# Patient Record
Sex: Female | Born: 1990 | Race: White | Hispanic: No | Marital: Single | Smoking: Never smoker
Health system: Southern US, Community
[De-identification: ages and names within clinical notes are randomized; demographics above are authoritative.]

## PROBLEM LIST (undated history)

## (undated) DIAGNOSIS — B009 Herpesviral infection, unspecified: Secondary | ICD-10-CM

---

## 2016-09-29 ENCOUNTER — Emergency Department (HOSPITAL_COMMUNITY)
Admission: EM | Admit: 2016-09-29 | Discharge: 2016-09-29 | Disposition: A | Discharge status: Home / Self Care | Payer: Self-pay | Attending: Dermatology | Admitting: Dermatology

## 2016-09-29 ENCOUNTER — Encounter (HOSPITAL_COMMUNITY): Payer: Self-pay | Admitting: *Deleted

## 2016-09-29 ENCOUNTER — Encounter (HOSPITAL_COMMUNITY): Payer: Self-pay | Admitting: Emergency Medicine

## 2016-09-29 ENCOUNTER — Inpatient Hospital Stay (HOSPITAL_COMMUNITY): Payer: Medicaid Other

## 2016-09-29 ENCOUNTER — Inpatient Hospital Stay (HOSPITAL_COMMUNITY)
Admission: AD | Admit: 2016-09-29 | Discharge: 2016-09-30 | Disposition: A | Discharge status: Home / Self Care | Payer: Medicaid Other | Source: Ambulatory Visit | Attending: Obstetrics and Gynecology | Admitting: Obstetrics and Gynecology

## 2016-09-29 DIAGNOSIS — Z5321 Procedure and treatment not carried out due to patient leaving prior to being seen by health care provider: Secondary | ICD-10-CM | POA: Insufficient documentation

## 2016-09-29 DIAGNOSIS — O209 Hemorrhage in early pregnancy, unspecified: Secondary | ICD-10-CM | POA: Diagnosis not present

## 2016-09-29 DIAGNOSIS — O26891 Other specified pregnancy related conditions, first trimester: Secondary | ICD-10-CM | POA: Insufficient documentation

## 2016-09-29 DIAGNOSIS — Z3A01 Less than 8 weeks gestation of pregnancy: Secondary | ICD-10-CM | POA: Diagnosis not present

## 2016-09-29 DIAGNOSIS — Z91040 Latex allergy status: Secondary | ICD-10-CM | POA: Diagnosis not present

## 2016-09-29 DIAGNOSIS — N938 Other specified abnormal uterine and vaginal bleeding: Secondary | ICD-10-CM | POA: Insufficient documentation

## 2016-09-29 DIAGNOSIS — R1030 Lower abdominal pain, unspecified: Secondary | ICD-10-CM | POA: Diagnosis not present

## 2016-09-29 DIAGNOSIS — N939 Abnormal uterine and vaginal bleeding, unspecified: Secondary | ICD-10-CM | POA: Diagnosis present

## 2016-09-29 HISTORY — DX: Herpesviral infection, unspecified: B00.9

## 2016-09-29 LAB — URINALYSIS, ROUTINE W REFLEX MICROSCOPIC
BACTERIA UA: NONE SEEN
BILIRUBIN URINE: NEGATIVE
Glucose, UA: NEGATIVE mg/dL
Ketones, ur: NEGATIVE mg/dL
LEUKOCYTES UA: NEGATIVE
NITRITE: NEGATIVE
PH: 6 (ref 5.0–8.0)
Protein, ur: NEGATIVE mg/dL
SPECIFIC GRAVITY, URINE: 1.015 (ref 1.005–1.030)
WBC, UA: NONE SEEN WBC/hpf (ref 0–5)

## 2016-09-29 LAB — CBC WITH DIFFERENTIAL/PLATELET
Basophils Absolute: 0 10*3/uL (ref 0.0–0.1)
Basophils Relative: 0 %
EOS PCT: 1 %
Eosinophils Absolute: 0.1 10*3/uL (ref 0.0–0.7)
HCT: 35.8 % — ABNORMAL LOW (ref 36.0–46.0)
Hemoglobin: 12.2 g/dL (ref 12.0–15.0)
LYMPHS ABS: 4.7 10*3/uL — AB (ref 0.7–4.0)
LYMPHS PCT: 34 %
MCH: 30.3 pg (ref 26.0–34.0)
MCHC: 34.1 g/dL (ref 30.0–36.0)
MCV: 88.8 fL (ref 78.0–100.0)
MONO ABS: 0.6 10*3/uL (ref 0.1–1.0)
Monocytes Relative: 4 %
Neutro Abs: 8.4 10*3/uL — ABNORMAL HIGH (ref 1.7–7.7)
Neutrophils Relative %: 61 %
PLATELETS: 322 10*3/uL (ref 150–400)
RBC: 4.03 MIL/uL (ref 3.87–5.11)
RDW: 12.7 % (ref 11.5–15.5)
WBC: 13.8 10*3/uL — ABNORMAL HIGH (ref 4.0–10.5)

## 2016-09-29 LAB — WET PREP, GENITAL
CLUE CELLS WET PREP: NONE SEEN
Sperm: NONE SEEN
Trich, Wet Prep: NONE SEEN
Yeast Wet Prep HPF POC: NONE SEEN

## 2016-09-29 LAB — POCT PREGNANCY, URINE: Preg Test, Ur: POSITIVE — AB

## 2016-09-29 NOTE — ED Triage Notes (Signed)
Pt states that LMP was 11/7.  States that she thinks she is about [redacted] wks pregnant.  Has not been to the doctor yet.  Had some bleeding yesterday but got heavier today.  Bleeding was similar to her period today.  States that she thinks the bleeding has stopped.  Slight cramping.

## 2016-09-29 NOTE — ED Triage Notes (Signed)
Upon finishing triage, patient stated that she couldn't wait after seeing the lobby.  Pt stated that she wanted to [facility] to [facility].

## 2016-09-29 NOTE — MAU Provider Note (Signed)
History     CSN: [id number]  Arrival date and time: 09/29/16 2221   First Provider Initiated Contact with Patient 09/29/16 2259      Chief Complaint  Patient presents with  . Vaginal Bleeding   HPI Ms. Taliah Dalbert GarnetBeasley is a 25 y.o. G3P1011 at 2282w3d who presents to MAU today with complaint of vaginal bleeding and lower abdominal pain. The patient states that bleeding started yesterday and became heavier today. She states that it has become much lighter over the last couple of hours and is now brown. The bleeding started after intercourse. She states mild associated lower abdominal cramping as well. She denies UTI symptoms, fever or N/V. She states LMP 08/08/16 and regular periods, recent +HPT.     OB History    Gravida Para Term Preterm AB Living   3 1 1  0 1 1   SAB TAB Ectopic Multiple Live Births   0 1 0 0 1      Past Medical History:  Diagnosis Date  . HSV infection     History reviewed. No pertinent surgical history.  History reviewed. No pertinent family history.  Social History  Substance Use Topics  . Smoking status: Never Smoker  . Smokeless tobacco: Never Used  . Alcohol use No    Allergies:  Allergies  Allergen Reactions  . Latex     swelling    Prescriptions Prior to Admission  Medication Sig Dispense Refill Last Dose  . Prenatal Vit-Fe Fumarate-FA (PRENATAL MULTIVITAMIN) TABS tablet Take 1 tablet by mouth daily at 12 noon.   09/28/2016 at Unknown time    Review of Systems  Constitutional: Negative for fever and malaise/fatigue.  Gastrointestinal: Positive for abdominal pain. Negative for constipation, diarrhea, nausea and vomiting.  Genitourinary: Negative for dysuria, frequency and urgency.       + vaginal bleeding   Physical Exam   Blood pressure 121/65, pulse 83, temperature 98.4 F (36.9 C), resp. rate 18, height 5\' 9"  (1.753 m), weight 180 lb 6.4 oz (81.8 kg), last menstrual period 08/08/2016, unknown if currently breastfeeding.  Physical  Exam  Nursing note and vitals reviewed. Constitutional: She is oriented to person, place, and time. She appears well-developed and well-nourished. No distress.  HENT:  Head: Normocephalic and atraumatic.  Cardiovascular: Normal rate.   Respiratory: Effort normal.  GI: Soft. She exhibits no distension and no mass. There is no tenderness. There is no rebound and no guarding.  Genitourinary: Uterus is not enlarged and not tender. Cervix exhibits no motion tenderness, no discharge and no friability. Right adnexum displays no mass and no tenderness. Left adnexum displays no mass and no tenderness. There is bleeding (scant dark brown blood noted) in the vagina. No vaginal discharge found.  Neurological: She is alert and oriented to person, place, and time.  Skin: Skin is warm and dry. No erythema.  Psychiatric: She has a normal mood and affect.   Results for orders placed or performed during the hospital encounter of 09/29/16 (from the past 24 hour(s))  Urinalysis, Routine w reflex microscopic     Status: Abnormal   Collection Time: 09/29/16 10:38 PM  Result Value Ref Range   Color, Urine YELLOW YELLOW   APPearance CLEAR CLEAR   Specific Gravity, Urine 1.015 1.005 - 1.030   pH 6.0 5.0 - 8.0   Glucose, UA NEGATIVE NEGATIVE mg/dL   Hgb urine dipstick MODERATE (A) NEGATIVE   Bilirubin Urine NEGATIVE NEGATIVE   Ketones, ur NEGATIVE NEGATIVE mg/dL   Protein,  ur NEGATIVE NEGATIVE mg/dL   Nitrite NEGATIVE NEGATIVE   Leukocytes, UA NEGATIVE NEGATIVE   RBC / HPF 0-5 0 - 5 RBC/hpf   WBC, UA NONE SEEN 0 - 5 WBC/hpf   Bacteria, UA NONE SEEN NONE SEEN   Squamous Epithelial / LPF 0-5 (A) NONE SEEN   Mucous PRESENT   Pregnancy, urine POC     Status: Abnormal   Collection Time: 09/29/16 10:49 PM  Result Value Ref Range   Preg Test, Ur POSITIVE (A) NEGATIVE  Wet prep, genital     Status: Abnormal   Collection Time: 09/29/16 11:10 PM  Result Value Ref Range   Yeast Wet Prep HPF POC NONE SEEN NONE  SEEN   Trich, Wet Prep NONE SEEN NONE SEEN   Clue Cells Wet Prep HPF POC NONE SEEN NONE SEEN   WBC, Wet Prep HPF POC MODERATE (A) NONE SEEN   Sperm NONE SEEN   CBC with Differential/Platelet     Status: Abnormal   Collection Time: 09/29/16 11:13 PM  Result Value Ref Range   WBC 13.8 (H) 4.0 - 10.5 K/uL   RBC 4.03 3.87 - 5.11 MIL/uL   Hemoglobin 12.2 12.0 - 15.0 g/dL   HCT 96.235.8 (L) 95.236.0 - 84.146.0 %   MCV 88.8 78.0 - 100.0 fL   MCH 30.3 26.0 - 34.0 pg   MCHC 34.1 30.0 - 36.0 g/dL   RDW 32.412.7 40.111.5 - 02.715.5 %   Platelets 322 150 - 400 K/uL   Neutrophils Relative % 61 %   Neutro Abs 8.4 (H) 1.7 - 7.7 K/uL   Lymphocytes Relative 34 %   Lymphs Abs 4.7 (H) 0.7 - 4.0 K/uL   Monocytes Relative 4 %   Monocytes Absolute 0.6 0.1 - 1.0 K/uL   Eosinophils Relative 1 %   Eosinophils Absolute 0.1 0.0 - 0.7 K/uL   Basophils Relative 0 %   Basophils Absolute 0.0 0.0 - 0.1 K/uL  ABO/Rh     Status: None (Preliminary result)   Collection Time: 09/29/16 11:13 PM  Result Value Ref Range   ABO/RH(D) A POS   hCG, quantitative, pregnancy     Status: Abnormal   Collection Time: 09/29/16 11:13 PM  Result Value Ref Range   hCG, Beta Chain, Quant, S 20,927 (H) <5 mIU/mL   [place] Ob Comp Less 14 Wks  Result Date: 09/30/2016 CLINICAL DATA:  Vaginal bleeding and cramping since yesterday. EXAM: OBSTETRIC <14 WK US AND TRANSVAGINAL OB US TECHNIQUE: Both transabdominal and transvaginal ultrasound examinations were performed for complete evaluation of the gestation as well as the maternal uterus, adnexal regions, and pelvic cul-de-sac. Transvaginal technique was performed to assess early pregnancy. COMPARISON:  None. FINDINGS: Intrauterine gestational sac: Single Yolk sac:  Visualized. Embryo:  Not Visualized. Cardiac Activity: Not applicable Heart Rate:  Not applicable MSD: 16.1  mm   6 w   3  d Subchorionic hemorrhage:  None visualized. Maternal uterus/adnexae: Normal bilateral ovaries. IMPRESSION: Probable early  intrauterine gestational sac and yolk sac but no fetal pole, or cardiac activity yet visualized. Recommend follow-up quantitative B-HCG levels and follow-up US in 14 days to assess viability. This recommendation follows SRU consensus guidelines: Malva Limes Engl J Med 2013; 253:6644-03369:1443-51. Electronically Signed   By: Tollie Ethavid  Kwon M.D.   On: 09/30/2016 00:04   [place] Ob Transvaginal  Result Date: 09/30/2016 CLINICAL DATA:  Vaginal bleeding and cramping since yesterday. EXAM: OBSTETRIC <14 WK US AND TRANSVAGINAL OB US TECHNIQUE: Both transabdominal and transvaginal ultrasound examinations  were performed for complete evaluation of the gestation as well as the maternal uterus, adnexal regions, and pelvic cul-de-sac. Transvaginal technique was performed to assess early pregnancy. COMPARISON:  None. FINDINGS: Intrauterine gestational sac: Single Yolk sac:  Visualized. Embryo:  Not Visualized. Cardiac Activity: Not applicable Heart Rate:  Not applicable MSD: 16.1  mm   6 w   3  d Subchorionic hemorrhage:  None visualized. Maternal uterus/adnexae: Normal bilateral ovaries. IMPRESSION: Probable early intrauterine gestational sac and yolk sac but no fetal pole, or cardiac activity yet visualized. Recommend follow-up quantitative B-HCG levels and follow-up US in 14 days to assess viability. This recommendation follows SRU consensus guidelines: Malva Limes Med 2013; 161:0960-45. Electronically Signed   By: Tollie Eth M.D.   On: 09/30/2016 00:04     MAU Course  Procedures None  MDM +UPT UA, wet prep, GC/chlamydia, CBC, ABO/Rh, quant hCG, HIV, RPR and [place] today to rule out ectopic pregnancy  Assessment and Plan  A: SIUP at [redacted]w[redacted]d Vaginal bleeding in pregnancy, first trimester   P: Discharge home Tylenol PRN for pain Bleeding precautions discussed Patient advised to follow-up with OB provider of choice to start prenatal care Pregnancy confirmation letter given with list of area providers Patient requested letter for dentist  in order to continue required dental care. Letter provided.  Patient may return to MAU as needed or if her condition were to change or worsen   Marny Lowenstein, PA-C  09/29/2016, 10:59 PM

## 2016-09-29 NOTE — MAU Note (Signed)
Had a +upt. Started having some spotting yesterday, but tonight had some heavier bleeding. Has not worn pad today but had to change underwear once. Having some lower abdominal cramping-rates 4/10. Has not taken anything for pain. LMP: 08/08/2016.

## 2016-09-29 NOTE — MAU Note (Signed)
Was at [facility] but 5hr wait so came here. Some spotting yesterday. This evening had a lot more bleeding. Some cramping today.

## 2016-09-30 DIAGNOSIS — O209 Hemorrhage in early pregnancy, unspecified: Secondary | ICD-10-CM | POA: Diagnosis not present

## 2016-09-30 LAB — RPR: RPR Ser Ql: NONREACTIVE

## 2016-09-30 LAB — ABO/RH: ABO/RH(D): A POS

## 2016-09-30 LAB — HCG, QUANTITATIVE, PREGNANCY: hCG, Beta Chain, Quant, S: 20927 m[IU]/mL — ABNORMAL HIGH (ref <5)

## 2016-09-30 NOTE — Progress Notes (Signed)
Julie Wenzel PA in to discuss test results and d/c plan with pt. Written and verbal d/c instructions given and understanding voiced. 

## 2016-09-30 NOTE — Discharge Instructions (Signed)
Vaginal Bleeding During Pregnancy, First Trimester °A small amount of bleeding (spotting) from the vagina is common in early pregnancy. Sometimes the bleeding is normal and is not a problem, and sometimes it is a sign of something serious. Be sure to tell your doctor about any bleeding from your vagina right away. °Follow these instructions at home: °· Watch your condition for any changes. °· Follow your doctor's instructions about how active you can be. °· If you are on bed rest: °¨ You may need to stay in bed and only get up to use the bathroom. °¨ You may be allowed to do some activities. °¨ If you need help, make plans for someone to help you. °· Write down: °¨ The number of pads you use each day. °¨ How often you change pads. °¨ How soaked (saturated) your pads are. °· Do not use tampons. °· Do not douche. °· Do not have sex or orgasms until your doctor says it is okay. °· If you pass any tissue from your vagina, save the tissue so you can show it to your doctor. °· Only take medicines as told by your doctor. °· Do not take aspirin because it can make you bleed. °· Keep all follow-up visits as told by your doctor. °Contact a doctor if: °· You bleed from your vagina. °· You have cramps. °· You have labor pains. °· You have a fever that does not go away after you take medicine. °Get help right away if: °· You have very bad cramps in your back or belly (abdomen). °· You pass large clots or tissue from your vagina. °· You bleed more. °· You feel light-headed or weak. °· You pass out (faint). °· You have chills. °· You are leaking fluid or have a gush of fluid from your vagina. °· You pass out while pooping (having a bowel movement). °This information is not intended to replace advice given to you by your health care provider. Make sure you discuss any questions you have with your health care provider. °Document Released: 02/02/2014 Document Revised: 02/24/2016 Document Reviewed: 05/26/2013 °Elsevier Interactive  Patient Education © 2017 Elsevier Inc. ° °

## 2016-10-01 ENCOUNTER — Inpatient Hospital Stay (HOSPITAL_COMMUNITY)
Admission: AD | Admit: 2016-10-01 | Discharge: 2016-10-01 | Disposition: A | Discharge status: Home / Self Care | Payer: BLUE CROSS/BLUE SHIELD | Source: Ambulatory Visit | Attending: Obstetrics and Gynecology | Admitting: Obstetrics and Gynecology

## 2016-10-01 ENCOUNTER — Inpatient Hospital Stay (HOSPITAL_COMMUNITY): Payer: BLUE CROSS/BLUE SHIELD

## 2016-10-01 ENCOUNTER — Encounter (HOSPITAL_COMMUNITY): Payer: Self-pay | Admitting: *Deleted

## 2016-10-01 DIAGNOSIS — Z8619 Personal history of other infectious and parasitic diseases: Secondary | ICD-10-CM | POA: Insufficient documentation

## 2016-10-01 DIAGNOSIS — O209 Hemorrhage in early pregnancy, unspecified: Secondary | ICD-10-CM

## 2016-10-01 DIAGNOSIS — O039 Complete or unspecified spontaneous abortion without complication: Secondary | ICD-10-CM | POA: Insufficient documentation

## 2016-10-01 DIAGNOSIS — Z3A01 Less than 8 weeks gestation of pregnancy: Secondary | ICD-10-CM | POA: Insufficient documentation

## 2016-10-01 LAB — URINALYSIS, ROUTINE W REFLEX MICROSCOPIC
Bacteria, UA: NONE SEEN
Bilirubin Urine: NEGATIVE
GLUCOSE, UA: NEGATIVE mg/dL
KETONES UR: NEGATIVE mg/dL
Leukocytes, UA: NEGATIVE
Nitrite: NEGATIVE
PH: 5 (ref 5.0–8.0)
Protein, ur: NEGATIVE mg/dL
SPECIFIC GRAVITY, URINE: 1.018 (ref 1.005–1.030)

## 2016-10-01 LAB — HIV ANTIBODY (ROUTINE TESTING W REFLEX): HIV SCREEN 4TH GENERATION: NONREACTIVE

## 2016-10-01 MED ORDER — IBUPROFEN 600 MG PO TABS: 600.0000 mg | ORAL_TABLET | Freq: Four times a day (QID) | ORAL | 1 refills | Status: AC | PRN

## 2016-10-01 MED ORDER — MISOPROSTOL 200 MCG PO TABS
400.0000 ug | ORAL_TABLET | Freq: Once | ORAL | Status: AC
  Administered 2016-10-01: 400 ug via BUCCAL
  Filled 2016-10-01: qty 2

## 2016-10-01 MED ORDER — KETOROLAC TROMETHAMINE 60 MG/2ML IM SOLN
60.0000 mg | Freq: Once | INTRAMUSCULAR | Status: AC
  Administered 2016-10-01: 60 mg via INTRAMUSCULAR
  Filled 2016-10-01: qty 2

## 2016-10-01 NOTE — MAU Provider Note (Signed)
Chief Complaint: Vaginal Bleeding and Abdominal Cramping   First Provider Initiated Contact with Patient 10/01/16 1827        SUBJECTIVE HPI: Stacey Allen is a [age] y.o. G3P1011 at 1346w5d by LMP who presents to maternity admissions reporting increased bleeding and cramping with passage of tissue at home.. She denies vaginal itching/burning, urinary symptoms, h/a, dizziness, n/v, or fever/chills.    Very much wanting to find out if this was indeed the gestational sac. Discussed it will not change our treatment but patient states she will not have peace of mind unless she knows for sure.  Very nervous about the whole thing.  Vaginal Bleeding  The patient's primary symptoms include pelvic pain and vaginal bleeding. The patient's pertinent negatives include no genital itching, genital lesions or genital odor. This is a recurrent problem. The current episode started today. The problem has been gradually worsening. She is pregnant. Associated symptoms include abdominal pain. Pertinent negatives include no back pain, chills, constipation, diarrhea, dysuria, fever, frequency, nausea or vomiting. The vaginal discharge was bloody. The vaginal bleeding is heavier than menses. She has been passing clots. She has been passing tissue. Nothing aggravates the symptoms. She has tried nothing for the symptoms.  Abdominal Cramping  Pertinent negatives include no constipation, diarrhea, dysuria, fever, frequency, nausea or vomiting.    RN Note: Pt was seen on Friday night for cramping and bleeding. Pt states the cramping and bleeding has gotten worse today. Pt states she passed something that looks like a mucous plug.  Past Medical History:  Diagnosis Date  . HSV infection    History reviewed. No pertinent surgical history. Social History   Social History  . Marital status: Single    Spouse name: N/A  . Number of children: N/A  . Years of education: N/A   Occupational History  . Not on file.   Social  History Main Topics  . Smoking status: Never Smoker  . Smokeless tobacco: Never Used  . Alcohol use No  . Drug use: Unknown  . Sexual activity: Not on file   Other Topics Concern  . Not on file   Social History Narrative  . No narrative on file   No current facility-administered medications on file prior to encounter.    Current Outpatient Prescriptions on File Prior to Encounter  Medication Sig Dispense Refill  . Prenatal Vit-Fe Fumarate-FA (PRENATAL MULTIVITAMIN) TABS tablet Take 1 tablet by mouth daily at 12 noon.     Allergies  Allergen Reactions  . Latex     swelling    I have reviewed patient's Past Medical Hx, Surgical Hx, Family Hx, Social Hx, medications and allergies.   ROS:  Review of Systems  Constitutional: Negative for chills and fever.  Gastrointestinal: Positive for abdominal pain. Negative for constipation, diarrhea, nausea and vomiting.  Genitourinary: Positive for pelvic pain and vaginal bleeding. Negative for dysuria and frequency.  Musculoskeletal: Negative for back pain.   Review of Systems  Other systems negative   Physical Exam  Physical Exam Patient Vitals for the past 24 hrs:  BP Temp Temp src Pulse Resp SpO2 Height Weight  10/01/16 1814 117/56 97.9 F (36.6 C) Oral 89 17 98 % 5\' 9"  (1.753 m) 180 lb (81.6 kg)   Constitutional: Well-developed, well-nourished female in no acute distress.  Cardiovascular: normal rate Respiratory: normal effort GI: Abd soft, non-tender. Pos BS x 4 MS: Extremities nontender, no edema, normal ROM Neurologic: Alert and oriented x 4.  GU: Neg CVAT.  PELVIC  EXAM: Cervix not clearly visualized due to clots and bleeding.  Clots removed.  Slow trickle, but no rapid hemorrhage   LAB RESULTS Results for orders placed or performed during the hospital encounter of 10/01/16 (from the past 72 hour(s))  Urinalysis, Routine w reflex microscopic     Status: Abnormal   Collection Time: 10/01/16  5:59 PM  Result Value  Ref Range   Color, Urine YELLOW YELLOW   APPearance CLEAR CLEAR   Specific Gravity, Urine 1.018 1.005 - 1.030   pH 5.0 5.0 - 8.0   Glucose, UA NEGATIVE NEGATIVE mg/dL   Hgb urine dipstick MODERATE (A) NEGATIVE   Bilirubin Urine NEGATIVE NEGATIVE   Ketones, ur NEGATIVE NEGATIVE mg/dL   Protein, ur NEGATIVE NEGATIVE mg/dL   Nitrite NEGATIVE NEGATIVE   Leukocytes, UA NEGATIVE NEGATIVE   RBC / HPF TOO NUMEROUS TO COUNT 0 - 5 RBC/hpf   WBC, UA 0-5 0 - 5 WBC/hpf   Bacteria, UA NONE SEEN NONE SEEN   Squamous Epithelial / LPF 0-5 (A) NONE SEEN   Mucous PRESENT      --/--/A POS (12/29 2313)  IMAGING [place] Ob Comp Less 14 Wks  Result Date: 09/30/2016 CLINICAL DATA:  Vaginal bleeding and cramping since yesterday. EXAM: OBSTETRIC <14 WK US AND TRANSVAGINAL OB US TECHNIQUE: Both transabdominal and transvaginal ultrasound examinations were performed for complete evaluation of the gestation as well as the maternal uterus, adnexal regions, and pelvic cul-de-sac. Transvaginal technique was performed to assess early pregnancy. COMPARISON:  None. FINDINGS: Intrauterine gestational sac: Single Yolk sac:  Visualized. Embryo:  Not Visualized. Cardiac Activity: Not applicable Heart Rate:  Not applicable MSD: 16.1  mm   6 w   3  d Subchorionic hemorrhage:  None visualized. Maternal uterus/adnexae: Normal bilateral ovaries. IMPRESSION: Probable early intrauterine gestational sac and yolk sac but no fetal pole, or cardiac activity yet visualized. Recommend follow-up quantitative B-HCG levels and follow-up US in 14 days to assess viability. This recommendation follows SRU consensus guidelines: Malva Limes Engl J Med 2013; 454:0981-19369:1443-51. Electronically Signed   By: Tollie Ethavid  Kwon M.D.   On: 09/30/2016 00:04   [place] Ob Transvaginal  Result Date: 10/01/2016 CLINICAL DATA:  Bleeding, spontaneous abortion in progress. Estimated age by first trimester ultrasound equals 6 weeks 3 days. EXAM: TRANSVAGINAL OB ULTRASOUND TECHNIQUE:  Transvaginal ultrasound was performed for complete evaluation of the gestation as well as the maternal uterus, adnexal regions, and pelvic cul-de-sac. COMPARISON:  Ultrasound 09/29/2018 FINDINGS: Intrauterine gestational sac: Not identified Yolk sac:  Not identified Embryo:  Not identified Subchorionic hemorrhage:  None visualized. Maternal uterus/adnexae: Endometrium of the uterus is thickened. There is echogenic clot within the endometrial canal. Activity bleeding on exam per sonographer report. No free fluid. IMPRESSION: 1. Spontaneous abortion in progress. 2. No gestational sac identified. 3. Hemorrhage / clot within the endometrial canal. Electronically Signed   By: Genevive BiStewart  Edmunds M.D.   On: 10/01/2016 19:35   [place] Ob Transvaginal  Result Date: 09/30/2016 CLINICAL DATA:  Vaginal bleeding and cramping since yesterday. EXAM: OBSTETRIC <14 WK US AND TRANSVAGINAL OB US TECHNIQUE: Both transabdominal and transvaginal ultrasound examinations were performed for complete evaluation of the gestation as well as the maternal uterus, adnexal regions, and pelvic cul-de-sac. Transvaginal technique was performed to assess early pregnancy. COMPARISON:  None. FINDINGS: Intrauterine gestational sac: Single Yolk sac:  Visualized. Embryo:  Not Visualized. Cardiac Activity: Not applicable Heart Rate:  Not applicable MSD: 16.1  mm   6 w   3  d  Subchorionic hemorrhage:  None visualized. Maternal uterus/adnexae: Normal bilateral ovaries. IMPRESSION: Probable early intrauterine gestational sac and yolk sac but no fetal pole, or cardiac activity yet visualized. Recommend follow-up quantitative B-HCG levels and follow-up US in 14 days to assess viability. This recommendation follows SRU consensus guidelines: Malva Limes Med 2013; 161:0960-45. Electronically Signed   By: Tollie Eth M.D.   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   ASSESSMENT Pregnancy at [redacted]w[redacted]d Completed SAB  PLAN Discharge home Discussed expected bleeding and cramping for a few hours.  Bleeding precautions  Pt stable at time of discharge. Encouraged to return here or to other Urgent Care/ED if she develops worsening of symptoms, increase in pain, fever, or other concerning symptoms.    Wynelle Bourgeois CNM, MSN Certified Nurse-Midwife 10/01/2016  6:27 PM

## 2016-10-01 NOTE — Discharge Instructions (Signed)

## 2016-10-01 NOTE — MAU Note (Signed)
Pt was seen on Friday night for cramping and bleeding. Pt states the cramping and bleeding has gotten worse today. Pt states she passed something that looks like a mucous plug.

## 2016-10-03 LAB — GC/CHLAMYDIA PROBE AMP (~~LOC~~) NOT AT ARMC
Chlamydia: NEGATIVE
NEISSERIA GONORRHEA: NEGATIVE

## 2016-10-09 ENCOUNTER — Inpatient Hospital Stay (HOSPITAL_COMMUNITY)
Admission: AD | Admit: 2016-10-09 | Discharge: 2016-10-09 | Disposition: A | Discharge status: Home / Self Care | Payer: BLUE CROSS/BLUE SHIELD | Source: Ambulatory Visit | Attending: Family Medicine | Admitting: Family Medicine

## 2016-10-09 ENCOUNTER — Encounter (HOSPITAL_COMMUNITY): Payer: Self-pay | Admitting: *Deleted

## 2016-10-09 DIAGNOSIS — N760 Acute vaginitis: Secondary | ICD-10-CM | POA: Insufficient documentation

## 2016-10-09 DIAGNOSIS — B9689 Other specified bacterial agents as the cause of diseases classified elsewhere: Secondary | ICD-10-CM | POA: Insufficient documentation

## 2016-10-09 DIAGNOSIS — N898 Other specified noninflammatory disorders of vagina: Secondary | ICD-10-CM

## 2016-10-09 DIAGNOSIS — O039 Complete or unspecified spontaneous abortion without complication: Secondary | ICD-10-CM

## 2016-10-09 LAB — CBC WITH DIFFERENTIAL/PLATELET
Basophils Absolute: 0 10*3/uL (ref 0.0–0.1)
Basophils Relative: 0 %
EOS ABS: 0.1 10*3/uL (ref 0.0–0.7)
Eosinophils Relative: 1 %
HCT: 36.7 % (ref 36.0–46.0)
Hemoglobin: 12.4 g/dL (ref 12.0–15.0)
LYMPHS ABS: 3.8 10*3/uL (ref 0.7–4.0)
Lymphocytes Relative: 41 %
MCH: 30.2 pg (ref 26.0–34.0)
MCHC: 33.8 g/dL (ref 30.0–36.0)
MCV: 89.3 fL (ref 78.0–100.0)
MONOS PCT: 3 %
Monocytes Absolute: 0.3 10*3/uL (ref 0.1–1.0)
NEUTROS PCT: 55 %
Neutro Abs: 5 10*3/uL (ref 1.7–7.7)
Platelets: 328 10*3/uL (ref 150–400)
RBC: 4.11 MIL/uL (ref 3.87–5.11)
RDW: 12.8 % (ref 11.5–15.5)
WBC: 9.2 10*3/uL (ref 4.0–10.5)

## 2016-10-09 LAB — URINALYSIS, ROUTINE W REFLEX MICROSCOPIC
Bilirubin Urine: NEGATIVE
GLUCOSE, UA: NEGATIVE mg/dL
KETONES UR: NEGATIVE mg/dL
LEUKOCYTES UA: NEGATIVE
NITRITE: NEGATIVE
PROTEIN: NEGATIVE mg/dL
Specific Gravity, Urine: 1.025 (ref 1.005–1.030)
pH: 6 (ref 5.0–8.0)

## 2016-10-09 LAB — WET PREP, GENITAL
Clue Cells Wet Prep HPF POC: NONE SEEN
Sperm: NONE SEEN
Trich, Wet Prep: NONE SEEN
Yeast Wet Prep HPF POC: NONE SEEN

## 2016-10-09 LAB — URINALYSIS, MICROSCOPIC (REFLEX): WBC UA: NONE SEEN WBC/hpf (ref 0–5)

## 2016-10-09 LAB — HCG, QUANTITATIVE, PREGNANCY: HCG, BETA CHAIN, QUANT, S: 418 m[IU]/mL — AB (ref <5)

## 2016-10-09 MED ORDER — METRONIDAZOLE 0.75 % VA GEL: 1.0000 | Freq: Every day | VAGINAL | 0 refills | Status: AC

## 2016-10-09 NOTE — MAU Provider Note (Signed)
History     CSN: [id number]  Arrival date and time: 10/09/16 1015   First Provider Initiated Contact with Patient 10/09/16 1117      Chief Complaint  Patient presents with  . vaginal odor  . Abdominal Pain   HPI   Ms.Stacey Allen is a 26 y.o. female [id number] here in MAU with vaginal odor.  She has had multiple episodes of BV and feels this is the cause of her vaginal odor. She recently had a complete SAB and was seen in MAU on 12/31. She reports that the bleeding from her miscarriage has just about stopped. She continues to have mild lower abdominal pain. She denies fever, chills.   She was given one dose of buccal Cytotec prior to leaving MAU on 12/31; she does not have follow up scheduled.   OB History    Gravida Para Term Preterm AB Living   3 1 1  0 2 1   SAB TAB Ectopic Multiple Live Births   1 1 0 0 1      Past Medical History:  Diagnosis Date  . HSV infection     History reviewed. No pertinent surgical history.  History reviewed. No pertinent family history.  Social History  Substance Use Topics  . Smoking status: Never Smoker  . Smokeless tobacco: Never Used  . Alcohol use No    Allergies:  Allergies  Allergen Reactions  . Latex     swelling    Prescriptions Prior to Admission  Medication Sig Dispense Refill Last Dose  . acyclovir (ZOVIRAX) 400 MG tablet Take 400 mg by mouth 2 (two) times daily.  0 10/08/2016 at Unknown time  . Multiple Vitamin (MULTIVITAMIN WITH MINERALS) TABS tablet Take 1 tablet by mouth at bedtime.   10/08/2016 at Unknown time  . Probiotic Product (PROBIOTIC PO) Take 1 capsule by mouth at bedtime.   10/08/2016 at Unknown time  . ibuprofen (ADVIL,MOTRIN) 600 MG tablet Take 1 tablet (600 mg total) by mouth every 6 (six) hours as needed. (Patient not taking: Reported on 10/09/2016) 30 tablet 1 Not Taking at Unknown time   Results for orders placed or performed during the hospital encounter of 10/09/16 (from the past 48 hour(s))  Urinalysis,  Routine w reflex microscopic     Status: Abnormal   Collection Time: 10/09/16 10:29 AM  Result Value Ref Range   Color, Urine YELLOW YELLOW   APPearance CLEAR CLEAR   Specific Gravity, Urine 1.025 1.005 - 1.030   pH 6.0 5.0 - 8.0   Glucose, UA NEGATIVE NEGATIVE mg/dL   Hgb urine dipstick SMALL (A) NEGATIVE   Bilirubin Urine NEGATIVE NEGATIVE   Ketones, ur NEGATIVE NEGATIVE mg/dL   Protein, ur NEGATIVE NEGATIVE mg/dL   Nitrite NEGATIVE NEGATIVE   Leukocytes, UA NEGATIVE NEGATIVE  Urinalysis, Microscopic (reflex)     Status: Abnormal   Collection Time: 10/09/16 10:29 AM  Result Value Ref Range   RBC / HPF 0-5 0 - 5 RBC/hpf   WBC, UA NONE SEEN 0 - 5 WBC/hpf   Bacteria, UA RARE (A) NONE SEEN   Squamous Epithelial / LPF 0-5 (A) NONE SEEN  Wet prep, genital     Status: Abnormal   Collection Time: 10/09/16 12:45 PM  Result Value Ref Range   Yeast Wet Prep HPF POC NONE SEEN NONE SEEN   Trich, Wet Prep NONE SEEN NONE SEEN   Clue Cells Wet Prep HPF POC NONE SEEN NONE SEEN   WBC, Wet Prep HPF POC FEW (  A) NONE SEEN    Comment: MANY BACTERIA SEEN   Sperm NONE SEEN   CBC with Differential     Status: None   Collection Time: 10/09/16  1:06 PM  Result Value Ref Range   WBC 9.2 4.0 - 10.5 K/uL   RBC 4.11 3.87 - 5.11 MIL/uL   Hemoglobin 12.4 12.0 - 15.0 g/dL   HCT 16.136.7 09.636.0 - 04.546.0 %   MCV 89.3 78.0 - 100.0 fL   MCH 30.2 26.0 - 34.0 pg   MCHC 33.8 30.0 - 36.0 g/dL   RDW 40.912.8 81.111.5 - 91.415.5 %   Platelets 328 150 - 400 K/uL   Neutrophils Relative % 55 %   Neutro Abs 5.0 1.7 - 7.7 K/uL   Lymphocytes Relative 41 %   Lymphs Abs 3.8 0.7 - 4.0 K/uL   Monocytes Relative 3 %   Monocytes Absolute 0.3 0.1 - 1.0 K/uL   Eosinophils Relative 1 %   Eosinophils Absolute 0.1 0.0 - 0.7 K/uL   Basophils Relative 0 %   Basophils Absolute 0.0 0.0 - 0.1 K/uL  hCG, quantitative, pregnancy     Status: Abnormal   Collection Time: 10/09/16  1:06 PM  Result Value Ref Range   hCG, Beta Chain, Quant, S 418 (H)  <5 mIU/mL    Comment:          GEST. AGE      CONC.  (mIU/mL)   <=1 WEEK        5 - 50     2 WEEKS       50 - 500     3 WEEKS       100 - 10,000     4 WEEKS     1,000 - 30,000     5 WEEKS     3,500 - 115,000   6-8 WEEKS     12,000 - 270,000    12 WEEKS     15,000 - 220,000        FEMALE AND NON-PREGNANT FEMALE:     LESS THAN 5 mIU/mL     Review of Systems  Constitutional: Negative for chills and fever.  Genitourinary: Positive for vaginal discharge.   Physical Exam   Blood pressure 114/58, pulse 88, temperature 97.4 F (36.3 C), temperature source Oral, resp. rate 18, last menstrual period 08/08/2016, unknown if currently breastfeeding.  Physical Exam  Constitutional: She is oriented to person, place, and time. She appears well-developed and well-nourished. No distress.  HENT:  Head: Normocephalic.  Eyes: Pupils are equal, round, and reactive to light.  Neck: Neck supple.  GI: Soft. She exhibits no distension. There is no tenderness. There is no rebound and no guarding.  Genitourinary:  Genitourinary Comments: Wet prep and GC collected without speculum  Cervix closed, no CMT . No blood noted on exam glove.  Uterus non tender, normal size Adnexa non tender, no masses bilaterally GC/Chlam, wet prep done Chaperone present for exam.   Musculoskeletal: Normal range of motion.  Neurological: She is alert and oriented to person, place, and time.  Skin: Skin is warm. She is not diaphoretic.  Psychiatric: Her behavior is normal.    MAU Course  Procedures  None  MDM  CBC & Quant  Wet prep without signs of infection.  Quant on 12/29: 20,927, today down to 400's  Assessment and Plan    A:  1. Vaginal discharge   2. SAB (spontaneous abortion)     P:  Discharge home in  stable condition  Return precautions discussed  Rx: Metrogel, patient insisting she has BV and requesting treatment.  Encouraged follow up with GYN or primary care as needed, if symptoms  worsen   Duane Lope, NP 10/09/2016 3:57 PM

## 2016-10-09 NOTE — Discharge Instructions (Signed)

## 2016-10-09 NOTE — MAU Provider Note (Signed)
History     CSN: [id number]  Arrival date and time: 10/09/16 1015   First Provider Initiated Contact with Patient 10/09/16 1117      Chief Complaint  Patient presents with  . vaginal odor  . Abdominal Pain   HPI  Stacey Allen is a 26 y.o. female, [phone] with a PMH significant for a recent SAB  (10/01/16) and multiple episodes of BV, who presents today for evaluation of vaginal discharge and odor. Patient reports she feels like she is getting BV again. Patient reports bleeding from miscarriage has significantly decreased to only minimal spotting, but has had increased vaginal discharge and an odor. Patient reports these are her typical symptoms with BV. Patient does reports some mild discomfort in her lower abdomen, but denies any fever, chills, nausea or vomiting.   Patient reports her last sexual encounter was Thursday, and she has one sexual partner. She reports using tampons a few times over the past week, but has primarily used pads. She reports she took one dose of Diflucan she had left over from a previous yeast infection over the weekend to see if that helped her symptoms.  On 10/01/16 patient was seen in [place] for heavy vaginal bleeding and spontaneous abortion was confirmed via ultrasound. A clot and bleeding w/ no gestational sac were evident on [place]. Patient was given one dose of buccal cytotec.  OB History    Gravida Para Term Preterm AB Living   3 1 1  0 2 1   SAB TAB Ectopic Multiple Live Births   1 1 0 0 1      Past Medical History:  Diagnosis Date  . HSV infection     History reviewed. No pertinent surgical history.  History reviewed. No pertinent family history.  Social History  Substance Use Topics  . Smoking status: Never Smoker  . Smokeless tobacco: Never Used  . Alcohol use No    Allergies:  Allergies  Allergen Reactions  . Latex     swelling    Prescriptions Prior to Admission  Medication Sig Dispense Refill Last Dose  . acyclovir (ZOVIRAX)  400 MG tablet Take 400 mg by mouth 2 (two) times daily.  0 10/08/2016 at Unknown time  . Multiple Vitamin (MULTIVITAMIN WITH MINERALS) TABS tablet Take 1 tablet by mouth at bedtime.   10/08/2016 at Unknown time  . Probiotic Product (PROBIOTIC PO) Take 1 capsule by mouth at bedtime.   10/08/2016 at Unknown time  . ibuprofen (ADVIL,MOTRIN) 600 MG tablet Take 1 tablet (600 mg total) by mouth every 6 (six) hours as needed. (Patient not taking: Reported on 10/09/2016) 30 tablet 1 Not Taking at Unknown time    Review of Systems  Constitutional: Negative for chills and fever.  Gastrointestinal: Negative for abdominal pain, constipation, diarrhea, nausea and vomiting.  Genitourinary: Positive for vaginal discharge. Negative for dysuria, frequency, pelvic pain and vaginal bleeding.  Neurological: Negative for dizziness and light-headedness.   Physical Exam   Blood pressure 114/58, pulse 88, temperature 97.4 F (36.3 C), temperature source Oral, resp. rate 18, last menstrual period 08/08/2016, unknown if currently breastfeeding.  Physical Exam  Constitutional: She is oriented to person, place, and time. She appears well-developed and well-nourished. No distress.  Cardiovascular: Normal rate.   Respiratory: Effort normal.  GI: Soft. Bowel sounds are normal. She exhibits no distension. There is no tenderness. There is no guarding.  Musculoskeletal: Normal range of motion.  Neurological: She is alert and oriented to person, place, and time.  Skin: Skin is warm and dry.   Results for orders placed or performed during the hospital encounter of 10/09/16 (from the past 24 hour(s))  Urinalysis, Routine w reflex microscopic     Status: Abnormal   Collection Time: 10/09/16 10:29 AM  Result Value Ref Range   Color, Urine YELLOW YELLOW   APPearance CLEAR CLEAR   Specific Gravity, Urine 1.025 1.005 - 1.030   pH 6.0 5.0 - 8.0   Glucose, UA NEGATIVE NEGATIVE mg/dL   Hgb urine dipstick SMALL (A) NEGATIVE    Bilirubin Urine NEGATIVE NEGATIVE   Ketones, ur NEGATIVE NEGATIVE mg/dL   Protein, ur NEGATIVE NEGATIVE mg/dL   Nitrite NEGATIVE NEGATIVE   Leukocytes, UA NEGATIVE NEGATIVE  Urinalysis, Microscopic (reflex)     Status: Abnormal   Collection Time: 10/09/16 10:29 AM  Result Value Ref Range   RBC / HPF 0-5 0 - 5 RBC/hpf   WBC, UA NONE SEEN 0 - 5 WBC/hpf   Bacteria, UA RARE (A) NONE SEEN   Squamous Epithelial / LPF 0-5 (A) NONE SEEN  Wet prep, genital     Status: Abnormal   Collection Time: 10/09/16 12:45 PM  Result Value Ref Range   Yeast Wet Prep HPF POC NONE SEEN NONE SEEN   Trich, Wet Prep NONE SEEN NONE SEEN   Clue Cells Wet Prep HPF POC NONE SEEN NONE SEEN   WBC, Wet Prep HPF POC FEW (A) NONE SEEN   Sperm NONE SEEN   CBC with Differential     Status: None   Collection Time: 10/09/16  1:06 PM  Result Value Ref Range   WBC 9.2 4.0 - 10.5 K/uL   RBC 4.11 3.87 - 5.11 MIL/uL   Hemoglobin 12.4 12.0 - 15.0 g/dL   HCT 16.136.7 09.636.0 - 04.546.0 %   MCV 89.3 78.0 - 100.0 fL   MCH 30.2 26.0 - 34.0 pg   MCHC 33.8 30.0 - 36.0 g/dL   RDW 40.912.8 81.111.5 - 91.415.5 %   Platelets 328 150 - 400 K/uL   Neutrophils Relative % 55 %   Neutro Abs 5.0 1.7 - 7.7 K/uL   Lymphocytes Relative 41 %   Lymphs Abs 3.8 0.7 - 4.0 K/uL   Monocytes Relative 3 %   Monocytes Absolute 0.3 0.1 - 1.0 K/uL   Eosinophils Relative 1 %   Eosinophils Absolute 0.1 0.0 - 0.7 K/uL   Basophils Relative 0 %   Basophils Absolute 0.0 0.0 - 0.1 K/uL  hCG, quantitative, pregnancy     Status: Abnormal   Collection Time: 10/09/16  1:06 PM  Result Value Ref Range   hCG, Beta Chain, Quant, S 418 (H) <5 mIU/mL    MAU Course  Procedures  MDM UA w/ micro Wet prep - neg, patient requests metrogel for tx of symptoms Quant hCG  Assessment and Plan    1. Bacterial Vaginosis  2. Completed Spontaneous Abortion  Discharge home in stable condition Rx- metrogel Outpatient follow-up with clinic to ensure complete resolution of SAB Return  to MAU for increasing pelvic pain, bleeding or fever or other emergencies  Dartha LodgeKelsey N Benuel Ly 10/09/2016, 2:07 PM

## 2016-10-09 NOTE — MAU Note (Signed)
Pt was seen previously for a miscarriage, has been about a week, thinks she has a bacterial infection.  Has vaginal odor, slight amount of discharge, also lower abd discomfort.

## 2016-10-11 LAB — GC/CHLAMYDIA PROBE AMP (~~LOC~~) NOT AT ARMC
Chlamydia: NEGATIVE
Neisseria Gonorrhea: NEGATIVE

## 2016-10-16 ENCOUNTER — Encounter: Payer: Medicaid Other | Admitting: Student

## 2017-10-04 ENCOUNTER — Encounter (HOSPITAL_COMMUNITY): Payer: Self-pay | Admitting: Emergency Medicine

## 2017-10-04 ENCOUNTER — Other Ambulatory Visit: Payer: Self-pay

## 2017-10-04 ENCOUNTER — Ambulatory Visit (HOSPITAL_COMMUNITY)
Admission: EM | Admit: 2017-10-04 | Discharge: 2017-10-04 | Disposition: A | Discharge status: Home / Self Care | Payer: BLUE CROSS/BLUE SHIELD | Attending: Family Medicine | Admitting: Family Medicine

## 2017-10-04 DIAGNOSIS — M791 Myalgia, unspecified site: Secondary | ICD-10-CM | POA: Diagnosis not present

## 2017-10-04 DIAGNOSIS — Z3202 Encounter for pregnancy test, result negative: Secondary | ICD-10-CM

## 2017-10-04 DIAGNOSIS — N898 Other specified noninflammatory disorders of vagina: Secondary | ICD-10-CM | POA: Diagnosis not present

## 2017-10-04 DIAGNOSIS — B349 Viral infection, unspecified: Secondary | ICD-10-CM

## 2017-10-04 DIAGNOSIS — J029 Acute pharyngitis, unspecified: Secondary | ICD-10-CM

## 2017-10-04 LAB — POCT URINALYSIS DIP (DEVICE)
Bilirubin Urine: NEGATIVE
Glucose, UA: NEGATIVE mg/dL
Ketones, ur: NEGATIVE mg/dL
Leukocytes, UA: NEGATIVE
Nitrite: NEGATIVE
PH: 6.5 (ref 5.0–8.0)
PROTEIN: NEGATIVE mg/dL
Specific Gravity, Urine: 1.02 (ref 1.005–1.030)
UROBILINOGEN UA: 0.2 mg/dL (ref 0.0–1.0)

## 2017-10-04 LAB — POCT PREGNANCY, URINE: PREG TEST UR: NEGATIVE

## 2017-10-04 LAB — POCT RAPID STREP A: Streptococcus, Group A Screen (Direct): NEGATIVE

## 2017-10-04 MED ORDER — FLUCONAZOLE 150 MG PO TABS: 150.0000 mg | ORAL_TABLET | Freq: Every day | ORAL | 0 refills | Status: AC

## 2017-10-04 MED ORDER — ACETAMINOPHEN 325 MG PO TABS: ORAL_TABLET | ORAL | 0 refills | Status: AC

## 2017-10-04 NOTE — ED Provider Notes (Signed)
[facility]    CSN: [id number] Arrival date & time: 10/04/17  1220     History   Chief Complaint Chief Complaint  Patient presents with  . Vaginal Discharge  . Sore Throat  . Generalized Body Aches    HPI Stacey Allen is a 27 y.o. female.   Complains of vaginal discharge x 1 week. Discharge is white without odor, says it feels like "yeast infection." Has history of yeast infections in the past. Also admits to sore throat x 1 day. Denies fever, sweats, chills.       Past Medical History:  Diagnosis Date  . HSV infection     There are no active problems to display for this patient.   History reviewed. No pertinent surgical history.  OB History    Gravida Para Term Preterm AB Living   3 1 1  0 2 1   SAB TAB Ectopic Multiple Live Births   1 1 0 0 1       Home Medications    Prior to Admission medications   Medication Sig Start Date End Date Taking? Authorizing Provider  Multiple Vitamin (MULTIVITAMIN WITH MINERALS) TABS tablet Take 1 tablet by mouth at bedtime.   Yes [provider]  Probiotic Product (PROBIOTIC PO) Take 1 capsule by mouth at bedtime.   Yes [provider]  acyclovir (ZOVIRAX) 400 MG tablet Take 400 mg by mouth 2 (two) times daily. 07/30/16   [provider]  fluconazole (DIFLUCAN) 150 MG tablet Take 1 tablet (150 mg total) by mouth daily. 10/04/17   Gustin Zobrist, DO  ibuprofen (ADVIL,MOTRIN) 600 MG tablet Take 1 tablet (600 mg total) by mouth every 6 (six) hours as needed. Patient not taking: Reported on 10/09/2016 10/01/16   Aviva Signs, CNM  metroNIDAZOLE (METROGEL VAGINAL) 0.75 % vaginal gel Place 1 Applicatorful vaginally at bedtime. 10/09/16   Rasch, Harolyn Rutherford, NP    Family History History reviewed. No pertinent family history.  Social History Social History   Tobacco Use  . Smoking status: Never Smoker  . Smokeless tobacco: Never Used  Substance Use Topics  . Alcohol use: No  . Drug use:  Not on file     Allergies   Latex   Review of Systems Review of Systems  Constitutional: Negative for chills and fever.  HENT: Positive for sore throat. Negative for congestion and ear pain.   Eyes: Negative for discharge and itching.  Respiratory: Negative for apnea and chest tightness.   Cardiovascular: Negative for chest pain and leg swelling.  Gastrointestinal: Negative for abdominal distention and abdominal pain.  Endocrine: Negative for cold intolerance and heat intolerance.  Genitourinary: Positive for vaginal discharge. Negative for difficulty urinating.  Neurological: Negative for dizziness and light-headedness.  Hematological: Negative for adenopathy. Does not bruise/bleed easily.     Physical Exam Triage Vital Signs ED Triage Vitals  Enc Vitals Group     BP 10/04/17 1314 123/77     Pulse Rate 10/04/17 1314 97     Resp --      Temp 10/04/17 1314 99.3 F (37.4 C)     Temp Source 10/04/17 1314 Oral     SpO2 10/04/17 1314 99 %     Weight --      Height --      Head Circumference --      Peak Flow --      Pain Score 10/04/17 1315 9     Pain Loc --  Pain Edu? --      Excl. in GC? --    No data found.  Updated Vital Signs BP 123/77 (BP Location: Left Arm)   Pulse 97   Temp 99.3 F (37.4 C) (Oral)   LMP 09/11/2017 (Exact Date)   SpO2 99%   Visual Acuity Right Eye Distance:   Left Eye Distance:   Bilateral Distance:    Right Eye Near:   Left Eye Near:    Bilateral Near:     Physical Exam  Constitutional: She is oriented to person, place, and time. She appears well-developed and well-nourished.  HENT:  Head: Normocephalic and atraumatic.  Right Ear: Hearing and tympanic membrane normal.  Left Ear: Hearing and tympanic membrane normal.  Mouth/Throat: Uvula is midline, oropharynx is clear and moist and mucous membranes are normal. Mucous membranes are not pale.  Eyes: EOM are normal. Pupils are equal, round, and reactive to light.  Neck: Normal  range of motion. Neck supple.  Cardiovascular: Normal rate and regular rhythm.  Pulmonary/Chest: Effort normal and breath sounds normal.  Abdominal: Soft. There is no tenderness.  Neurological: She is alert and oriented to person, place, and time.  Skin: Skin is warm and dry.  Psychiatric: She has a normal mood and affect. Her behavior is normal.     UC Treatments / Results  Labs (all labs ordered are listed, but only abnormal results are displayed) Labs Reviewed  POCT URINALYSIS DIP (DEVICE) - Abnormal; Notable for the following components:      Result Value   Hgb urine dipstick MODERATE (*)    All other components within normal limits  POCT PREGNANCY, URINE  CERVICOVAGINAL ANCILLARY ONLY    EKG  EKG Interpretation None       Radiology No results found.  Procedures Procedures (including critical care time)  Medications Ordered in UC Medications - No data to display   Initial Impression / Assessment and Plan / UC Course  I have reviewed the triage vital signs and the nursing notes.  Pertinent labs & imaging results that were available during my care of the patient were reviewed by me and considered in my medical decision making (see chart for details).   Gc/chlamydia and wet prep pending. Will treat for presumptive vaginal yeast infection because it is like previous infections. Will call with results. Supportive care for viral URI.  Final Clinical Impressions(s) / UC Diagnoses   Final diagnoses:  Vaginal discharge    ED Discharge Orders        Ordered    fluconazole (DIFLUCAN) 150 MG tablet  Daily     10/04/17 1350       Controlled Substance Prescriptions [place] Controlled Substance Registry consulted? Not Applicable   Rolm BookbinderMoss, Davanee Klinkner, DO 10/04/17 1355

## 2017-10-04 NOTE — ED Triage Notes (Signed)
Pt reports vaginal d/c 1 week ago, possibly a yeast infection.  She reports a sore throat yesterday and today body aches.

## 2017-10-05 LAB — CERVICOVAGINAL ANCILLARY ONLY
BACTERIAL VAGINITIS: NEGATIVE
CANDIDA VAGINITIS: POSITIVE — AB
CHLAMYDIA, DNA PROBE: NEGATIVE
Neisseria Gonorrhea: NEGATIVE
TRICH (WINDOWPATH): NEGATIVE

## 2017-10-07 LAB — CULTURE, GROUP A STREP (THRC)

## 2018-12-07 IMAGING — US US OB COMP LESS 14 WK
1 series · 15 of 28 positions shown · non-contrast
Comparison: None.

CLINICAL DATA: Vaginal bleeding and cramping since yesterday.

EXAM:
OBSTETRIC <14 WK US AND TRANSVAGINAL OB US
TECHNIQUE: Both transabdominal and transvaginal ultrasound examinations were
performed for complete evaluation of the gestation as well as the
maternal uterus, adnexal regions, and pelvic cul-de-sac.
Transvaginal technique was performed to assess early pregnancy.

[Series 1: us ob comp less 14 wk · 15 of 42 slices shown]
[im 1/42]
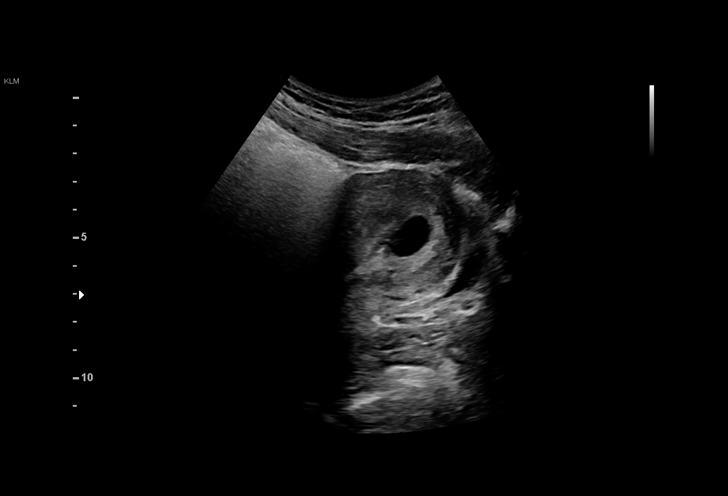
[im 4/42]
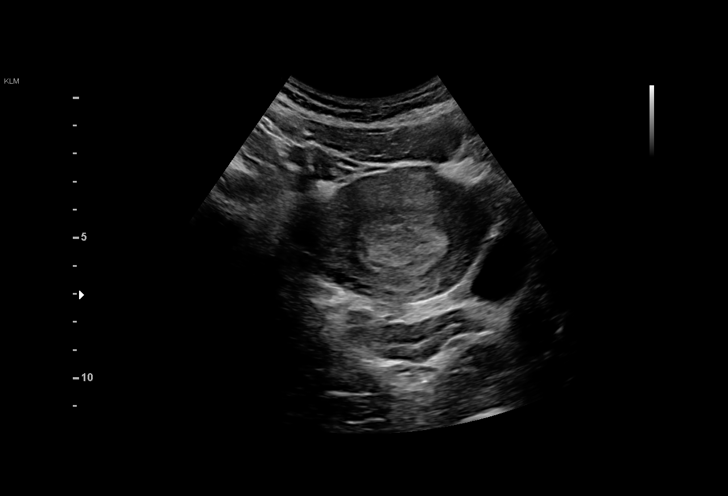
[im 7/42]
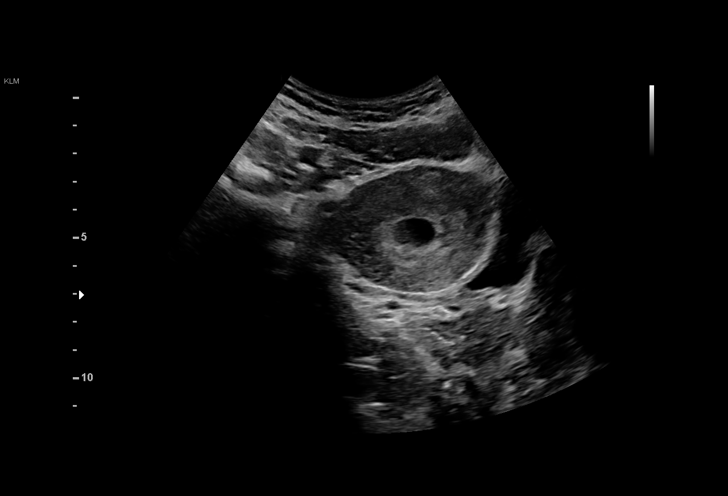
[im 10/42]
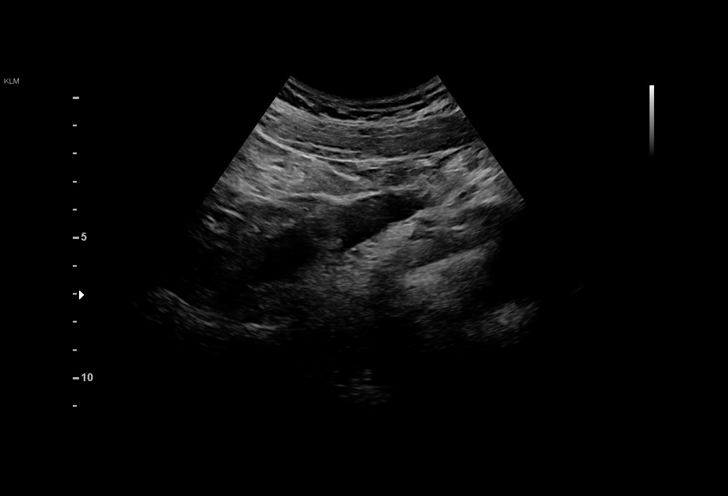
[im 13/42]
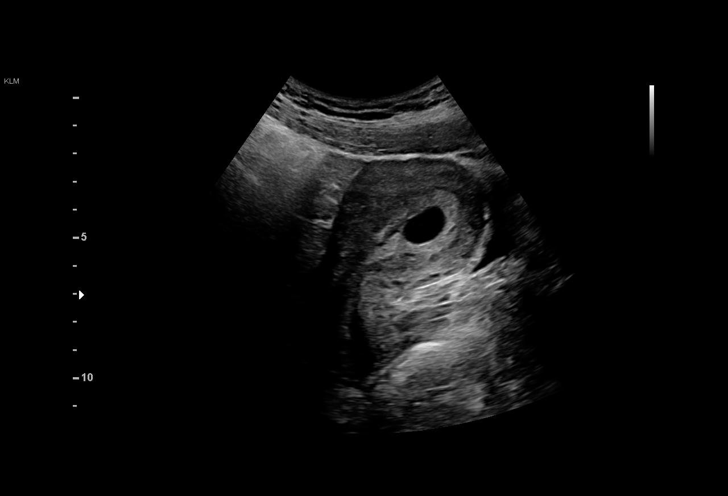
[im 16/42]
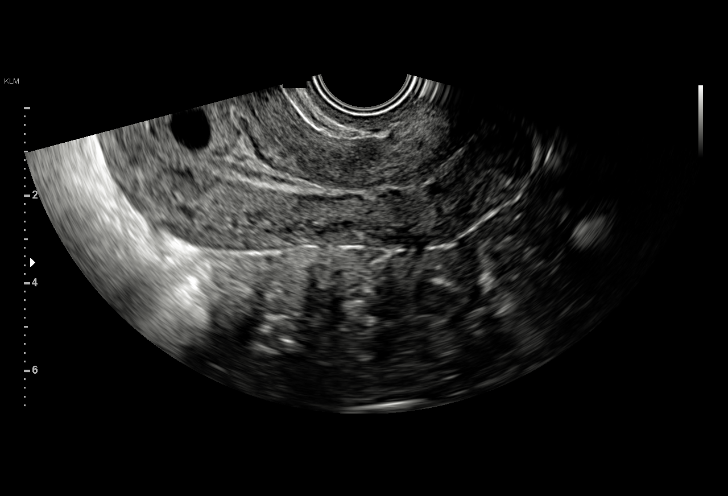
[im 19/42]
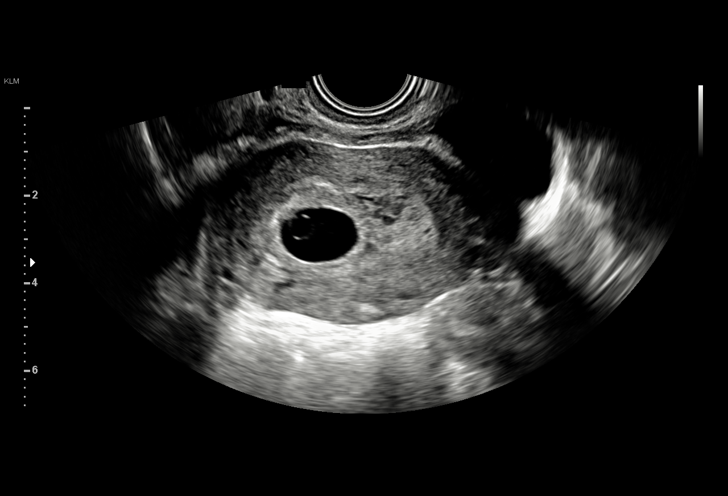
[im 22/42]
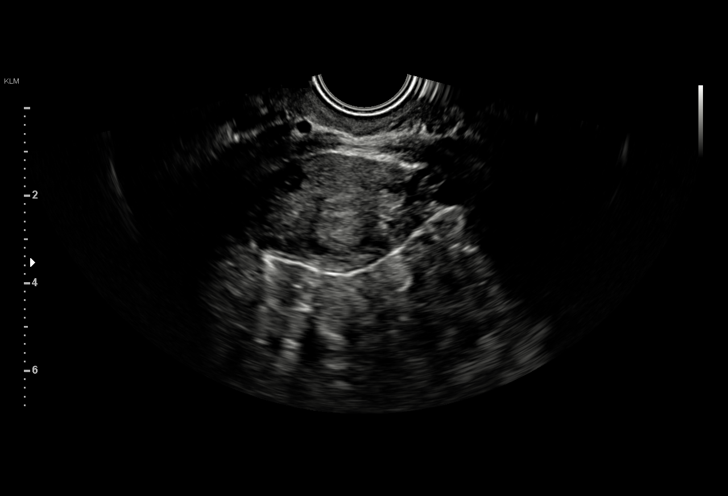
[im 23/42]
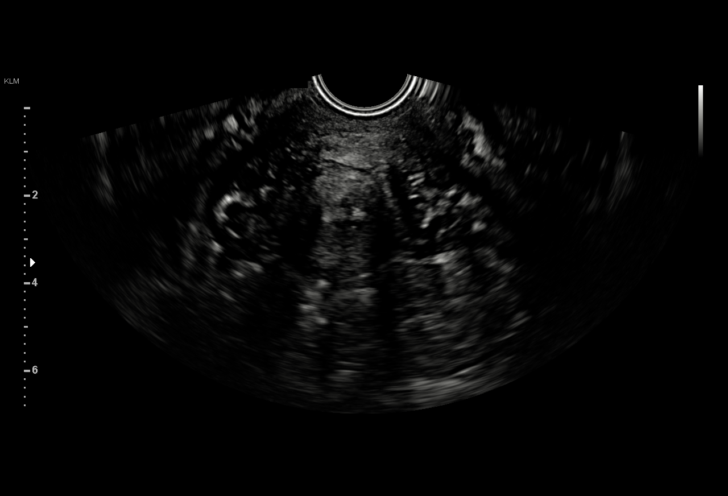
[im 26/42]
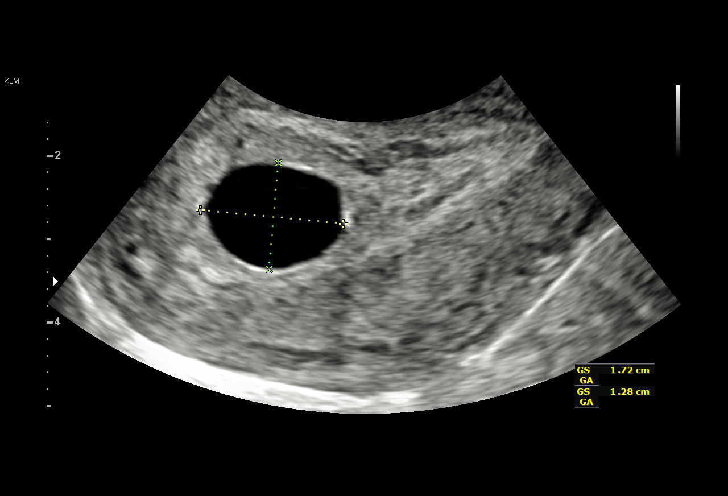
[im 29/42]
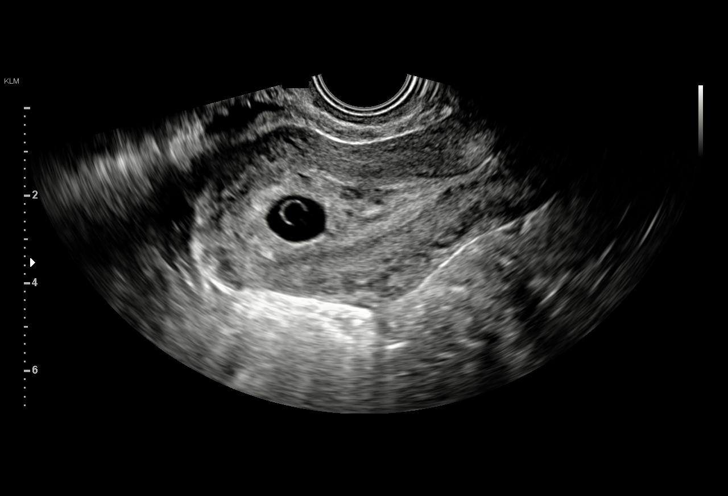
[im 32/42]
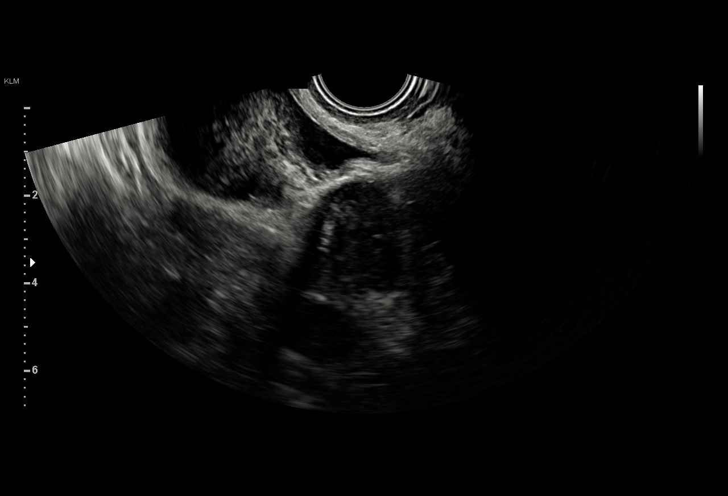
[im 35/42]
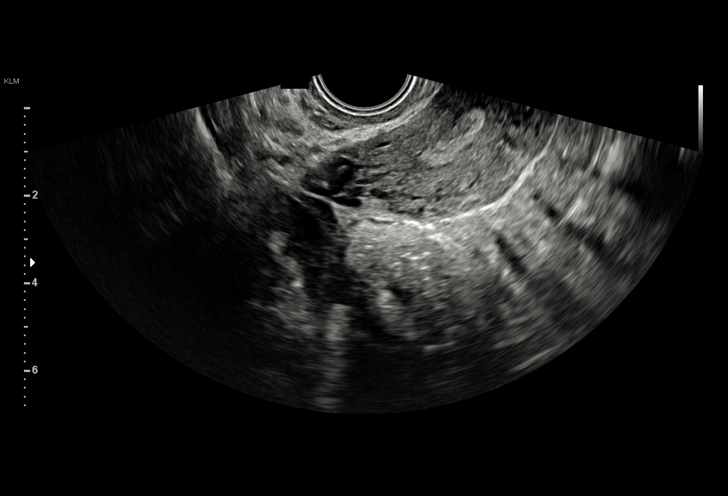
[im 38/42]
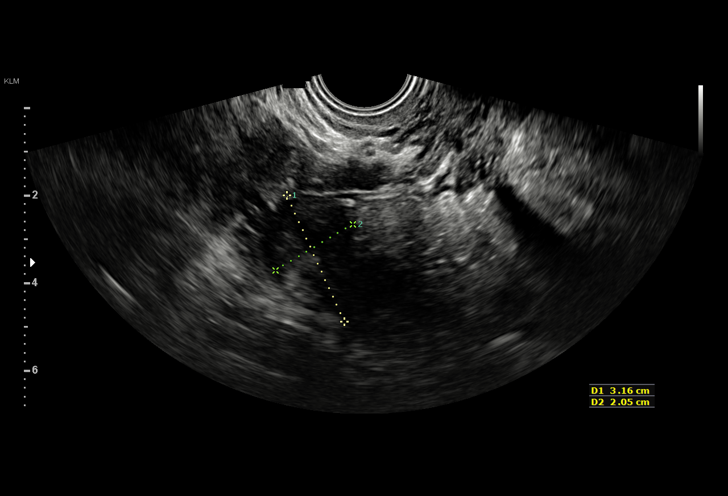
[im 42/42]
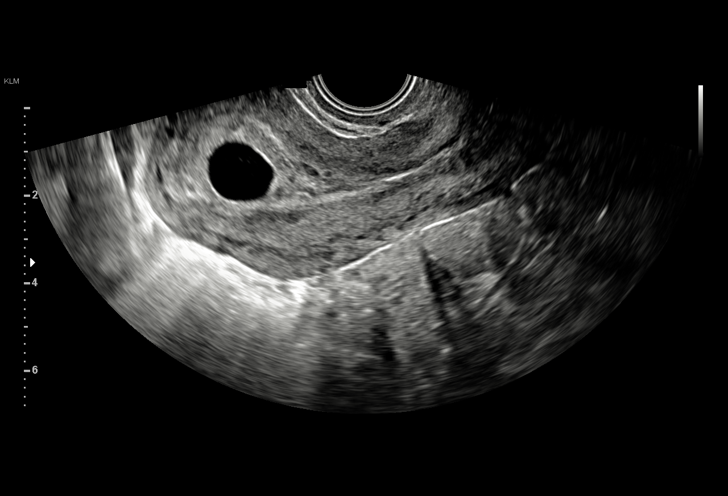

[15 of 28 positions shown; findings below may reference images not displayed]

FINDINGS: Intrauterine gestational sac: Single

Yolk sac:  Visualized.

Embryo:  Not Visualized.

Cardiac Activity: Not applicable

Heart Rate:  Not applicable

MSD: 16.1  mm   6 w   3  d

Subchorionic hemorrhage:  None visualized.

Maternal uterus/adnexae: Normal bilateral ovaries.
IMPRESSION: Probable early intrauterine gestational sac and yolk sac but no
fetal pole, or cardiac activity yet visualized. Recommend follow-up
quantitative B-HCG levels and follow-up US in 14 days to assess
viability. This recommendation follows SRU consensus guidelines: N
Engl J Med 3042; [DATE].

## 2018-12-09 IMAGING — US US OB TRANSVAGINAL
1 series · 15 of 16 positions shown · non-contrast
Comparison: Ultrasound 09/29/2018

CLINICAL DATA: Bleeding, spontaneous abortion in progress.
Estimated age by first trimester ultrasound equals 6 weeks 3 days.

EXAM:
TRANSVAGINAL OB ULTRASOUND
TECHNIQUE: Transvaginal ultrasound was performed for complete evaluation of the
gestation as well as the maternal uterus, adnexal regions, and
pelvic cul-de-sac.

[Series 1: us ob transvaginal · 16 acquisitions, 15 frames shown]
[im 1/16]
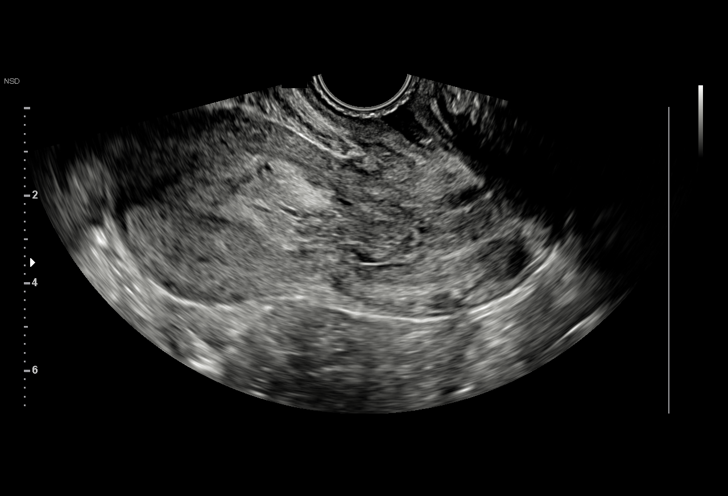
[im 2/16]
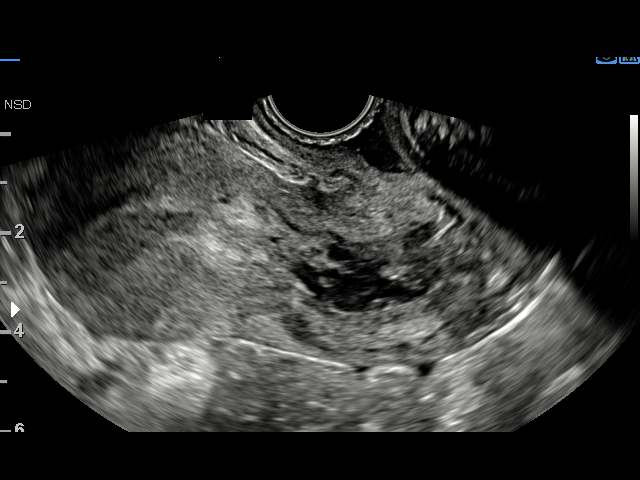
[im 3/16]
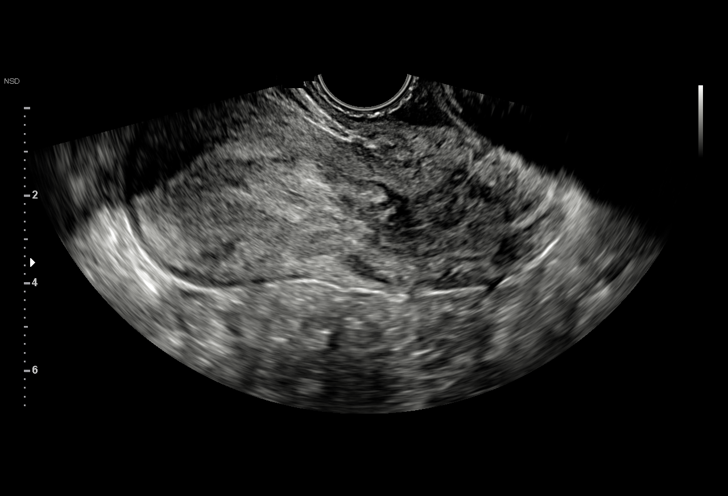
[im 4/16]
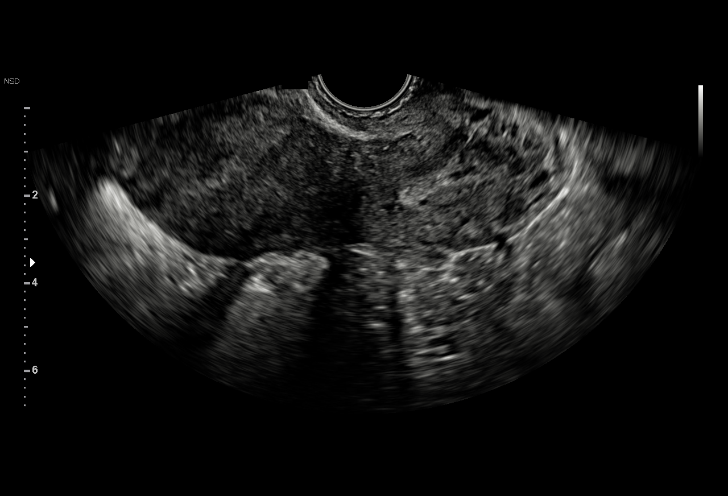
[im 5/16]
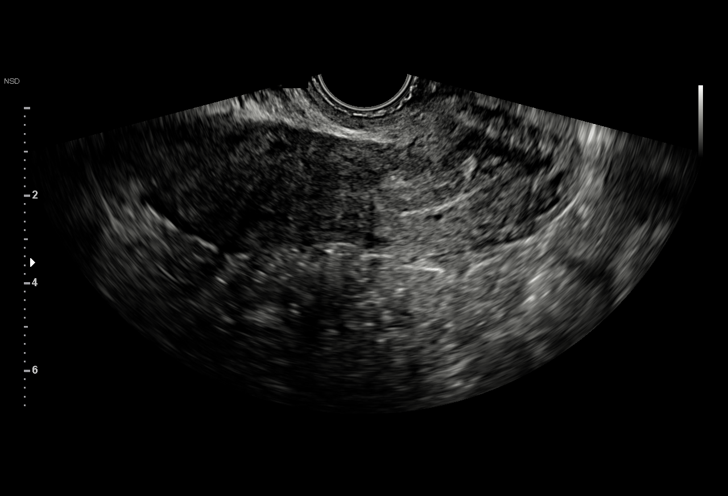
[im 6/16]
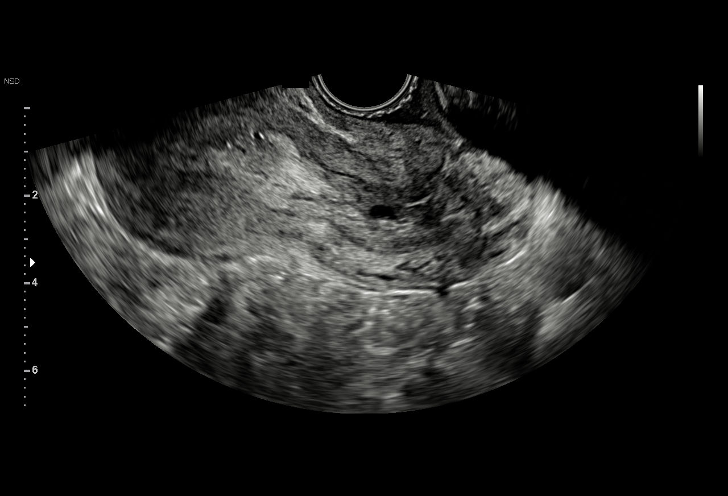
[im 7/16]
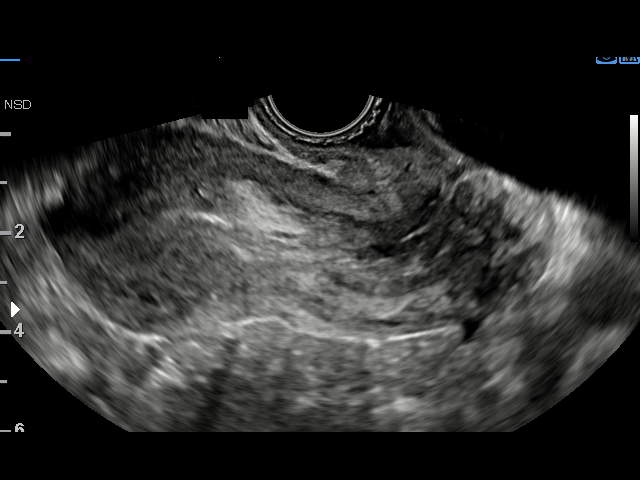
[im 9/16]
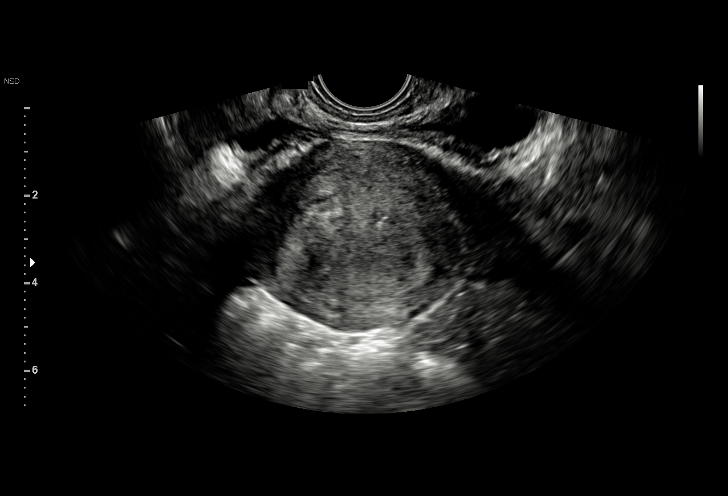
[im 10/16]
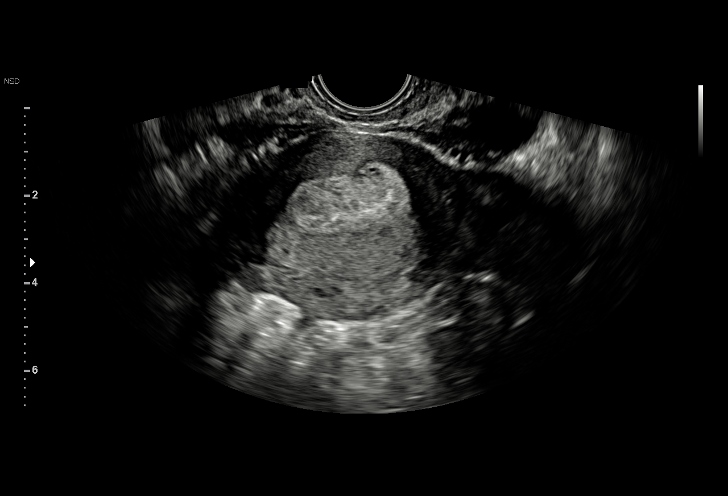
[im 11/16]
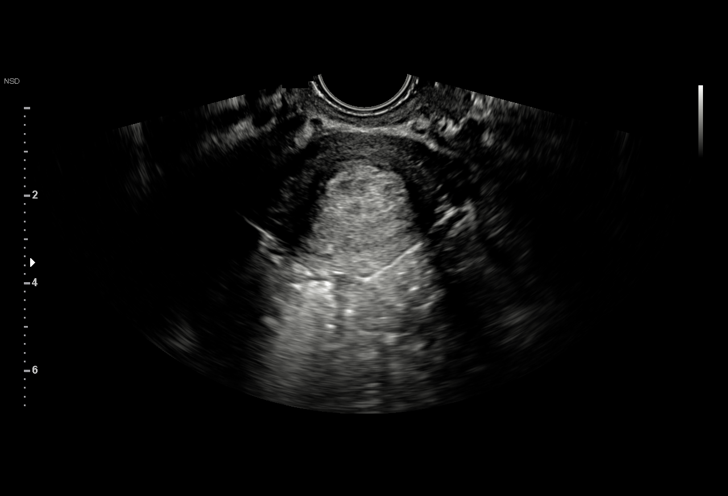
[im 12/16]
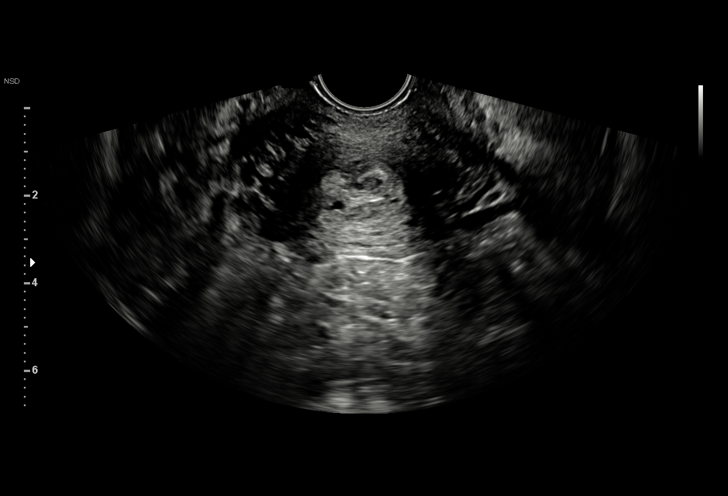
[im 13/16]
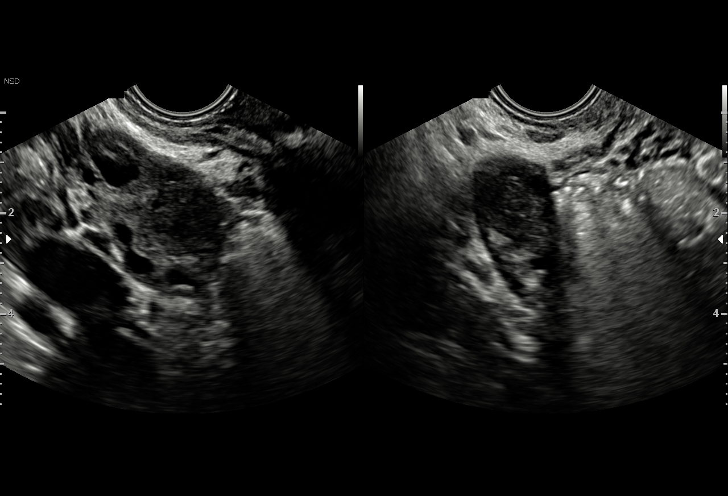
[im 14/16]
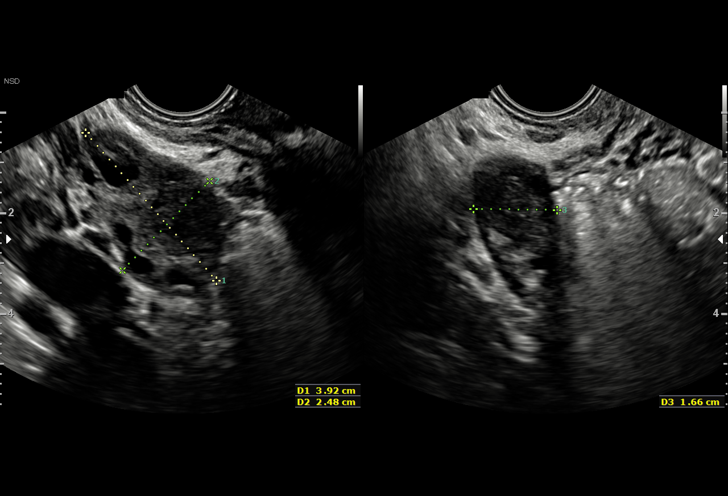
[im 15/16]
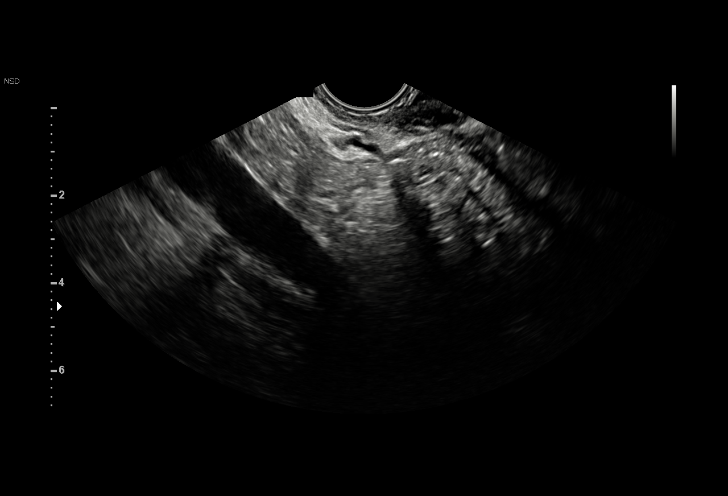
[im 16/16]
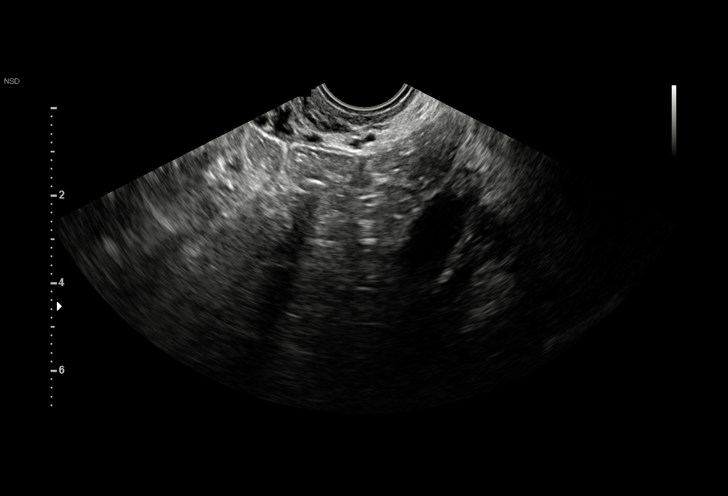

[15 of 16 positions shown; findings below may reference images not displayed]

FINDINGS: Intrauterine gestational sac: Not identified

Yolk sac:  Not identified

Embryo:  Not identified

Subchorionic hemorrhage:  None visualized.

Maternal uterus/adnexae: Endometrium of the uterus is thickened.
There is echogenic clot within the endometrial canal. Activity
bleeding on exam per sonographer report. No free fluid.
IMPRESSION: 1. Spontaneous abortion in progress.
2. No gestational sac identified.
3. Hemorrhage / clot within the endometrial canal.
